# Patient Record
Sex: Male | Born: 1995 | Race: Black or African American | Hispanic: No | Marital: Single | Smoking: Never smoker
Health system: Southern US, Community
[De-identification: ages and names within clinical notes are randomized; demographics above are authoritative.]

## PROBLEM LIST (undated history)

## (undated) DIAGNOSIS — B009 Herpesviral infection, unspecified: Secondary | ICD-10-CM

## (undated) DIAGNOSIS — F909 Attention-deficit hyperactivity disorder, unspecified type: Secondary | ICD-10-CM

---
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
## 2017-06-17 ENCOUNTER — Encounter: Payer: Self-pay | Admitting: Family Medicine

## 2017-06-17 ENCOUNTER — Ambulatory Visit (INDEPENDENT_AMBULATORY_CARE_PROVIDER_SITE_OTHER): Payer: BLUE CROSS/BLUE SHIELD | Admitting: Family Medicine

## 2017-06-17 VITALS — BP 115/68 | HR 57 | Temp 98.2°F | Resp 16 | Ht 72.0 in | Wt 162.8 lb

## 2017-06-17 DIAGNOSIS — Z113 Encounter for screening for infections with a predominantly sexual mode of transmission: Secondary | ICD-10-CM

## 2017-06-17 DIAGNOSIS — Z0000 Encounter for general adult medical examination without abnormal findings: Secondary | ICD-10-CM | POA: Diagnosis not present

## 2017-06-17 NOTE — Progress Notes (Signed)
Chief Complaint  Patient presents with  . New Patient (Initial Visit)    CPE    Subjective:  George Cohen is a 21 y.o. male here for a health maintenance visit.  Patient is new pt  There are no active problems to display for this patient.   Past Medical History:  Diagnosis Date  . ADHD (attention deficit hyperactivity disorder)     History reviewed. No pertinent surgical history.   Outpatient Medications Prior to Visit  Medication Sig Dispense Refill  . lisdexamfetamine (VYVANSE) 20 MG capsule Take 40 mg by mouth every morning. Increased within the last 2 days     No facility-administered medications prior to visit.     No Known Allergies   History reviewed. No pertinent family history.   Health Habits: Dental Exam: up to date Eye Exam: up to date Exercise: 3 times/week on average Current exercise activities: cardio/weights/basketball Diet: balanced  Social History   Social History  . Marital status: Single    Spouse name: N/A  . Number of children: N/A  . Years of education: N/A   Occupational History  . Not on file.   Social History Main Topics  . Smoking status: Never Smoker  . Smokeless tobacco: Never Used  . Alcohol use No  . Drug use: No  . Sexual activity: Yes    Partners: Female   Other Topics Concern  . Not on file   Social History Narrative  . No narrative on file   History  Alcohol Use No   History  Smoking Status  . Never Smoker  Smokeless Tobacco  . Never Used   History  Drug Use No    Health Maintenance: See under health Maintenance activity for review of completion dates as well.  There is no immunization history on file for this patient.   Depression Screen-PHQ2/9 Depression screen [facility] 2/9 06/17/2017 06/17/2017  Decreased Interest 0 0  Down, Depressed, Hopeless 0 0  PHQ - 2 Score 0 0       Depression Severity and Treatment Recommendations:  0-4= None  5-9= Mild / Treatment: Support, educate to call if  worse; return in one month  10-14= Moderate / Treatment: Support, watchful waiting; Antidepressant or Psycotherapy  15-19= Moderately severe / Treatment: Antidepressant OR Psychotherapy  >= 20 = Major depression, severe / Antidepressant AND Psychotherapy    Review of Systems   Review of Systems  Constitutional: Negative for chills, fever and weight loss.  HENT: Negative for hearing loss and sinus pain.   Eyes: Negative for blurred vision and double vision.  Respiratory: Negative for cough and shortness of breath.   Cardiovascular: Negative for chest pain and palpitations.  Gastrointestinal: Negative for abdominal pain, nausea and vomiting.  Genitourinary: Negative for dysuria, frequency and urgency.  Skin: Negative for itching and rash.  Neurological: Negative for dizziness, tingling and headaches.  Psychiatric/Behavioral: Negative for depression. The patient is not nervous/anxious.     See HPI for ROS as well.    Objective:   Vitals:   06/17/17 1109  BP: 115/68  Pulse: (!) 57  Resp: 16  Temp: 98.2 F (36.8 C)  TempSrc: Oral  SpO2: 98%  Weight: 162 lb 12.8 oz (73.8 kg)  Height: 6' (1.829 m)    Body mass index is 22.08 kg/m.  Physical Exam  Constitutional: He is oriented to person, place, and time. He appears well-developed and well-nourished.  HENT:  Head: Normocephalic and atraumatic.  Right Ear: External ear normal.  Left Ear: External ear normal.  Nose: Nose normal.  Mouth/Throat: Oropharynx is clear and moist.  Eyes: Pupils are equal, round, and reactive to light. Conjunctivae and EOM are normal. Right eye exhibits no discharge. Left eye exhibits no discharge.  Neck: Normal range of motion. No thyromegaly present.  Cardiovascular: Normal rate, regular rhythm, normal heart sounds and intact distal pulses.   No murmur heard. Pulmonary/Chest: Effort normal and breath sounds normal. No respiratory distress. He has no wheezes. He has no rales.  Abdominal: Soft.  Bowel sounds are normal. He exhibits no distension. There is no tenderness. There is no rebound.  Neurological: He is alert and oriented to person, place, and time. He has normal reflexes.  Skin: No erythema.  Psychiatric: He has a normal mood and affect. His behavior is normal. Judgment and thought content normal.       Assessment/Plan:   Patient was seen for a health maintenance exam.  Counseled the patient on health maintenance issues. Reviewed her health mainteance schedule and ordered appropriate tests (see orders.) Counseled on regular exercise and weight management. Recommend regular eye exams and dental cleaning.   The following issues were addressed today for health maintenance:   George Cohen was seen today for new patient (initial visit).  Diagnoses and all orders for this visit:  Health maintenance examination-  Advised pt to continue healthy lifestyle  Screen for STD (sexually transmitted disease) -     HIV antibody -     GC/Chlamydia Probe Amp -     RPR -     Hepatitis B surface antigen    No Follow-up on file.    Body mass index is 22.08 kg/m.:  Discussed the patient's BMI with patient. The BMI body mass index is 22.08 kg/m.     No future appointments.  Patient Instructions    HealthCare Administration - discuss with your college advisor    IF you received an x-ray today, you will receive an invoice from [facility]. Please contact [facility] at [phone] with questions or concerns regarding your invoice.   IF you received labwork today, you will receive an invoice from [facility]. Please contact [facility] at [phone] with questions or concerns regarding your invoice.   Our billing staff will not be able to assist you with questions regarding bills from these companies.  You will be contacted with the lab results as soon as they are available. The fastest way to get your results is to activate your My Chart account. Instructions  are located on the last page of this paperwork. If you have not heard from us regarding the results in 2 weeks, please contact this office.

## 2017-06-17 NOTE — Patient Instructions (Addendum)
  HealthCare Administration - discuss with your college advisor    IF you received an x-ray today, you will receive an invoice from [facility]. Please contact [facility] at [phone] with questions or concerns regarding your invoice.   IF you received labwork today, you will receive an invoice from [facility]. Please contact [facility] at [phone] with questions or concerns regarding your invoice.   Our billing staff will not be able to assist you with questions regarding bills from these companies.  You will be contacted with the lab results as soon as they are available. The fastest way to get your results is to activate your My Chart account. Instructions are located on the last page of this paperwork. If you have not heard from us regarding the results in 2 weeks, please contact this office.

## 2017-06-18 LAB — RPR: RPR: NONREACTIVE

## 2017-06-18 LAB — HIV ANTIBODY (ROUTINE TESTING W REFLEX): HIV Screen 4th Generation wRfx: NONREACTIVE

## 2017-06-18 LAB — HEPATITIS B SURFACE ANTIGEN: Hepatitis B Surface Ag: NEGATIVE

## 2017-06-22 LAB — GC/CHLAMYDIA PROBE AMP
Chlamydia trachomatis, NAA: POSITIVE — AB
Neisseria gonorrhoeae by PCR: NEGATIVE

## 2017-06-23 MED ORDER — AZITHROMYCIN 500 MG PO TABS: ORAL_TABLET | ORAL | 0 refills | Status: DC

## 2017-06-23 NOTE — Addendum Note (Signed)
Addended by: Collie SiadSTALLINGS, Yuepheng Schaller A on: 06/23/2017 11:38 AM   Modules accepted: Orders

## 2018-05-12 ENCOUNTER — Ambulatory Visit (INDEPENDENT_AMBULATORY_CARE_PROVIDER_SITE_OTHER): Payer: 59 | Admitting: Family Medicine

## 2018-05-12 ENCOUNTER — Other Ambulatory Visit: Payer: Self-pay

## 2018-05-12 ENCOUNTER — Encounter: Payer: Self-pay | Admitting: Family Medicine

## 2018-05-12 VITALS — BP 102/60 | HR 84 | Temp 99.4°F | Resp 17 | Ht 72.0 in | Wt 168.8 lb

## 2018-05-12 DIAGNOSIS — M545 Low back pain, unspecified: Secondary | ICD-10-CM

## 2018-05-12 NOTE — Progress Notes (Signed)
Chief Complaint  Patient presents with  . Back Pain    onset: 05/01/18, hurt lower back left side lifting a box and pulled muscle in back.  Filed workman's comp and MD gave robaxin 500 mg to help with pain, per pt didn't help.  Pt goes to see MD on friday to see if he has healed.  Per pt back still hurts and wanted his MD to check him out.  Pain level 5/10.    HPI   Date of injury 05/01/18 Hurt back while lifting a box Pain on the left side of the back  He said robaxin 500mg  was being taken at night but did not help   Now he states that he has irritating pain that is sharp in the lower back  Pain is present with all movement Pain is better when he lays down Pain is 5/10 He states that the pain is annoying   There is no difficulty with movement or walking    Past Medical History:  Diagnosis Date  . ADHD (attention deficit hyperactivity disorder)     No current outpatient medications on file.   No current facility-administered medications for this visit.     Allergies: No Known Allergies  History reviewed. No pertinent surgical history.  Social History   Socioeconomic History  . Marital status: Single    Spouse name: Not on file  . Number of children: Not on file  . Years of education: Not on file  . Highest education level: Not on file  Occupational History  . Not on file  Social Needs  . Financial resource strain: Not on file  . Food insecurity:    Worry: Not on file    Inability: Not on file  . Transportation needs:    Medical: Not on file    Non-medical: Not on file  Tobacco Use  . Smoking status: Never Smoker  . Smokeless tobacco: Never Used  Substance and Sexual Activity  . Alcohol use: No  . Drug use: No  . Sexual activity: Yes    Partners: Female  Lifestyle  . Physical activity:    Days per week: Not on file    Minutes per session: Not on file  . Stress: Not on file  Relationships  . Social connections:    Talks on phone: Not on file   Gets together: Not on file    Attends religious service: Not on file    Active member of club or organization: Not on file    Attends meetings of clubs or organizations: Not on file    Relationship status: Not on file  Other Topics Concern  . Not on file  Social History Narrative  . Not on file    History reviewed. No pertinent family history.   ROS Review of Systems See HPI Constitution: No fevers or chills No malaise No diaphoresis Skin: No rash or itching Eyes: no blurry vision, no double vision GU: no dysuria or hematuria Neuro: no dizziness or headaches all others reviewed and negative   Objective: Vitals:   05/12/18 1206  BP: 102/60  Pulse: 84  Resp: 17  Temp: 99.4 F (37.4 C)  TempSrc: Oral  SpO2: 98%  Weight: 168 lb 12.8 oz (76.6 kg)  Height: 6' (1.829 m)    Physical Exam  Constitutional: He is oriented to person, place, and time. He appears well-developed and well-nourished.  HENT:  Head: Normocephalic and atraumatic.  Eyes: Conjunctivae and EOM are normal.  Neck: Normal range  of motion. Neck supple.  Cardiovascular: Normal rate, regular rhythm and normal heart sounds.  No murmur heard. Pulmonary/Chest: Effort normal and breath sounds normal. No stridor. No respiratory distress.  Neurological: He is alert and oriented to person, place, and time.  Skin: Skin is warm. Capillary refill takes less than 2 seconds.  Psychiatric: He has a normal mood and affect. His behavior is normal. Judgment and thought content normal.    Lumbar Radiculopathy Exam Back exam: full range of motion, no tenderness, palpable spasm or pain on motion. Straight-leg raise: **Positive Negative bilaterally Reflexes:       Right leg: 2+ at knees bilaterally      Left leg: 2+ at knees bilaterally Strength: normal and equal bilaterally  Sensory exam: normal in both lower extremities.  Able to toe walk, heel walk without difficulty or obvious weakness. No obvious pain with hip  motion or log rolling of leg.  Assessment and Plan George Cohen was seen today for back pain.  Diagnoses and all orders for this visit:  Acute left-sided low back pain without sciatica -     Ambulatory referral to Physical Therapy  -  Discussed continuing heat and cold  -  Advised pt continue nsaids and robaxin   Trinidad Ingle A Hazley Dezeeuw

## 2018-05-12 NOTE — Patient Instructions (Addendum)
IF you received an x-ray today, you will receive an invoice from [facility]. Please contact [facility] at [phone] with questions or concerns regarding your invoice.   IF you received labwork today, you will receive an invoice from [facility]. Please contact [facility] at [phone] with questions or concerns regarding your invoice.   Our billing staff will not be able to assist you with questions regarding bills from these companies.  You will be contacted with the lab results as soon as they are available. The fastest way to get your results is to activate your My Chart account. Instructions are located on the last page of this paperwork. If you have not heard from us regarding the results in 2 weeks, please contact this office.     Back Pain, Adult Many adults have back pain from time to time. Common causes of back pain include:  A strained muscle or ligament.  Wear and tear (degeneration) of the spinal disks.  Arthritis.  A hit to the back.  Back pain can be short-lived (acute) or last a long time (chronic). A physical exam, lab tests, and imaging studies may be done to find the cause of your pain. Follow these instructions at home: Managing pain and stiffness  Take over-the-counter and prescription medicines only as told by your health care provider.  If directed, apply heat to the affected area as often as told by your health care provider. Use the heat source that your health care provider recommends, such as a moist heat pack or a heating pad. ? Place a towel between your skin and the heat source. ? Leave the heat on for 20-30 minutes. ? Remove the heat if your skin turns bright red. This is especially important if you are unable to feel pain, heat, or cold. You have a greater risk of getting burned.  If directed, apply ice to the injured area: ? Put ice in a plastic bag. ? Place a towel between your skin and the bag. ? Leave the ice on for 20  minutes, 2-3 times a day for the first 2-3 days. Activity  Do not stay in bed. Resting more than 1-2 days can delay your recovery.  Take short walks on even surfaces as soon as you are able. Try to increase the length of time you walk each day.  Do not sit, drive, or stand in one place for more than 30 minutes at a time. Sitting or standing for long periods of time can put stress on your back.  Use proper lifting techniques. When you bend and lift, use positions that put less stress on your back: ? [place] Bend[place]end your knees. ? Keep the load close to your body. ? Avoid twisting.  Exercise regularly as told by your health care provider. Exercising will help your back heal faster. This also helps prevent back injuries by keeping muscles strong and flexible.  Your health care provider may recommend that you see a physical therapist. This person can help you come up with a safe exercise program. Do any exercises as told by your physical therapist. Lifestyle  Maintain a healthy weight. Extra weight puts stress on your back and makes it difficult to have good posture.  Avoid activities or situations that make you feel anxious or stressed. Learn ways to manage anxiety and stress. One way to manage stress is through exercise. Stress and anxiety increase muscle tension and can make back pain worse. General instructions  Sleep on a firm mattress  in a comfortable position. Try lying on your side with your knees slightly bent. If you lie on your back, put a pillow under your knees.  Follow your treatment plan as told by your health care provider. This may include: ? Cognitive or behavioral therapy. ? Acupuncture or massage therapy. ? Meditation or yoga. Contact a health care provider if:  You have pain that is not relieved with rest or medicine.  You have increasing pain going down into your legs or buttocks.  Your pain does not improve in 2 weeks.  You have pain at night.  You lose weight.  You  have a fever or chills. Get help right away if:  You develop new bowel or bladder control problems.  You have unusual weakness or numbness in your arms or legs.  You develop nausea or vomiting.  You develop abdominal pain.  You feel faint. Summary  Many adults have back pain from time to time. A physical exam, lab tests, and imaging studies may be done to find the cause of your pain.  Use proper lifting techniques. When you bend and lift, use positions that put less stress on your back.  Take over-the-counter and prescription medicines and apply heat or ice as directed by your health care provider. This information is not intended to replace advice given to you by your health care provider. Make sure you discuss any questions you have with your health care provider. Document Released: 10/27/2005 Document Revised: 12/01/2016 Document Reviewed: 12/01/2016 Elsevier Interactive Patient Education  [employer]2018 [employer]

## 2018-06-05 ENCOUNTER — Encounter: Payer: Self-pay | Admitting: Family Medicine

## 2019-03-03 ENCOUNTER — Telehealth: Payer: Self-pay | Admitting: Family Medicine

## 2019-03-03 DIAGNOSIS — Z113 Encounter for screening for infections with a predominantly sexual mode of transmission: Secondary | ICD-10-CM

## 2019-04-27 NOTE — Telephone Encounter (Signed)
No notes

## 2019-09-01 ENCOUNTER — Other Ambulatory Visit: Payer: Self-pay

## 2019-09-01 DIAGNOSIS — Z20822 Contact with and (suspected) exposure to covid-19: Secondary | ICD-10-CM

## 2019-09-03 LAB — NOVEL CORONAVIRUS, NAA: SARS-CoV-2, NAA: NOT DETECTED

## 2019-10-10 ENCOUNTER — Other Ambulatory Visit: Payer: Self-pay

## 2019-10-10 ENCOUNTER — Ambulatory Visit (INDEPENDENT_AMBULATORY_CARE_PROVIDER_SITE_OTHER): Payer: 59 | Admitting: Family Medicine

## 2019-10-10 VITALS — BP 122/75 | HR 64 | Temp 99.1°F | Ht 72.0 in | Wt 180.4 lb

## 2019-10-10 DIAGNOSIS — Z299 Encounter for prophylactic measures, unspecified: Secondary | ICD-10-CM | POA: Diagnosis not present

## 2019-10-10 DIAGNOSIS — K644 Residual hemorrhoidal skin tags: Secondary | ICD-10-CM

## 2019-10-10 DIAGNOSIS — Z23 Encounter for immunization: Secondary | ICD-10-CM

## 2019-10-10 NOTE — Patient Instructions (Addendum)
For hemorrhoids Use preparation H for any itching or irritation of the anus  The main treatment is to avoid constipation Drink plenty of water Use metamucil first thing in the morning before you eat.  Drink plenty of water after. Wait 30 minutes to eat breakfast.  Do this every other day for 2 weeks.   The bleeding should decrease over time once you fix the constipation.     If you have lab work done today you will be contacted with your lab results within the next 2 weeks.  If you have not heard from [place] then please contact us. The fastest way to get your results is to register for My Chart.   IF you received an x-ray today, you will receive an invoice from [facility]. Please contact [facility] at [phone] with questions or concerns regarding your invoice.   IF you received labwork today, you will receive an invoice from [facility]. Please contact [facility] at [phone] with questions or concerns regarding your invoice.   Our billing staff will not be able to assist you with questions regarding bills from these companies.  You will be contacted with the lab results as soon as they are available. The fastest way to get your results is to activate your My Chart account. Instructions are located on the last page of this paperwork. If you have not heard from [place] regarding the results in 2 weeks, please contact this office.     Hemorrhoids Hemorrhoids are swollen veins in and around the rectum or anus. There are two types of hemorrhoids:  Internal hemorrhoids. These occur in the veins that are just inside the rectum. They may poke through to the outside and become irritated and painful.  External hemorrhoids. These occur in the veins that are outside the anus and can be felt as a painful swelling or hard lump near the anus. Most hemorrhoids do not cause serious problems, and they can be managed with home treatments such as diet and lifestyle changes. If home treatments  do not help the symptoms, procedures can be done to shrink or remove the hemorrhoids. What are the causes? This condition is caused by increased pressure in the anal area. This pressure may result from various things, including:  Constipation.  Straining to have a bowel movement.  Diarrhea.  Pregnancy.  Obesity.  Sitting for long periods of time.  Heavy lifting or other activity that causes you to strain.  Anal sex.  Riding a bike for a long period of time. What are the signs or symptoms? Symptoms of this condition include:  Pain.  Anal itching or irritation.  Rectal bleeding.  Leakage of stool (feces).  Anal swelling.  One or more lumps around the anus. How is this diagnosed? This condition can often be diagnosed through a visual exam. Other exams or tests may also be done, such as:  An exam that involves feeling the rectal area with a gloved hand (digital rectal exam).  An exam of the anal canal that is done using a small tube (anoscope).  A blood test, if you have lost a significant amount of blood.  A test to look inside the colon using a flexible tube with a camera on the end (sigmoidoscopy or colonoscopy). How is this treated? This condition can usually be treated at home. However, various procedures may be done if dietary changes, lifestyle changes, and other home treatments do not help your symptoms. These procedures can help make the hemorrhoids smaller or remove  them completely. Some of these procedures involve surgery, and others do not. Common procedures include:  Rubber band ligation. Rubber bands are placed at the base of the hemorrhoids to cut off their blood supply.  Sclerotherapy. Medicine is injected into the hemorrhoids to shrink them.  Infrared coagulation. A type of light energy is used to get rid of the hemorrhoids.  Hemorrhoidectomy surgery. The hemorrhoids are surgically removed, and the veins that supply them are tied off.  Stapled  hemorrhoidopexy surgery. The surgeon staples the base of the hemorrhoid to the rectal wall. Follow these instructions at home: Eating and drinking   Eat foods that have a lot of fiber in them, such as whole grains, beans, nuts, fruits, and vegetables.  Ask your health care provider about taking products that have added fiber (fiber supplements).  Reduce the amount of fat in your diet. You can do this by eating low-fat dairy products, eating less red meat, and avoiding processed foods.  Drink enough fluid to keep your urine pale yellow. Managing pain and swelling   Take warm sitz baths for 20 minutes, 3-4 times a day to ease pain and discomfort. You may do this in a bathtub or using a portable sitz bath that fits over the toilet.  If directed, apply ice to the affected area. Using ice packs between sitz baths may be helpful. ? Put ice in a plastic bag. ? Place a towel between your skin and the bag. ? Leave the ice on for 20 minutes, 2-3 times a day. General instructions  Take over-the-counter and prescription medicines only as told by your health care provider.  Use medicated creams or suppositories as told.  Get regular exercise. Ask your health care provider how much and what kind of exercise is best for you. In general, you should do moderate exercise for at least 30 minutes on most days of the week (150 minutes each week). This can include activities such as walking, biking, or yoga.  Go to the bathroom when you have the urge to have a bowel movement. Do not wait.  Avoid straining to have bowel movements.  Keep the anal area dry and clean. Use wet toilet paper or moist towelettes after a bowel movement.  Do not sit on the toilet for long periods of time. This increases blood pooling and pain.  Keep all follow-up visits as told by your health care provider. This is important. Contact a health care provider if you have:  Increasing pain and swelling that are not controlled by  treatment or medicine.  Difficulty having a bowel movement, or you are unable to have a bowel movement.  Pain or inflammation outside the area of the hemorrhoids. Get help right away if you have:  Uncontrolled bleeding from your rectum. Summary  Hemorrhoids are swollen veins in and around the rectum or anus.  Most hemorrhoids can be managed with home treatments such as diet and lifestyle changes.  Taking warm sitz baths can help ease pain and discomfort.  In severe cases, procedures or surgery can be done to shrink or remove the hemorrhoids. This information is not intended to replace advice given to you by your health care provider. Make sure you discuss any questions you have with your health care provider. Document Released: 10/24/2000 Document Revised: 11/04/2018 Document Reviewed: 03/18/2018 Elsevier Patient Education  2020 [employer].

## 2019-10-10 NOTE — Progress Notes (Signed)
Established Patient Office Visit  Subjective:  Patient ID: George Cohen, male    DOB: 10/14/1996  Age: 23 y.o. MRN: [id number]  CC:  Chief Complaint  Patient presents with  . Rectal Pain    w/ bm.x 1 month. Pt stated that it hurts when he first start the bowel movement and then pain starts to decrease.    HPI George Cohen presents for   Last episode was yesterday He reports that he had his first episode of bleeding after taking a protein pill  He states that it has been present with each BM After his BM it feels uncomfortable He staates that 5-6 He has a little discomfort   Past Medical History:  Diagnosis Date  . ADHD (attention deficit hyperactivity disorder)     No past surgical history on file.  No family history on file.  Social History   Socioeconomic History  . Marital status: Single    Spouse name: Not on file  . Number of children: Not on file  . Years of education: Not on file  . Highest education level: Not on file  Occupational History  . Not on file  Tobacco Use  . Smoking status: Never Smoker  . Smokeless tobacco: Never Used  Substance and Sexual Activity  . Alcohol use: No  . Drug use: No  . Sexual activity: Yes    Partners: Female  Other Topics Concern  . Not on file  Social History Narrative  . Not on file   Social Determinants of Health   Financial Resource Strain:   . Difficulty of Paying Living Expenses: Not on file  Food Insecurity:   . Worried About Programme researcher, broadcasting/film/video in the Last Year: Not on file  . Ran Out of Food in the Last Year: Not on file  Transportation Needs:   . Lack of Transportation (Medical): Not on file  . Lack of Transportation (Non-Medical): Not on file  Physical Activity:   . Days of Exercise per Week: Not on file  . Minutes of Exercise per Session: Not on file  Stress:   . Feeling of Stress : Not on file  Social Connections:   . Frequency of Communication with Friends and Family: Not on  file  . Frequency of Social Gatherings with Friends and Family: Not on file  . Attends Religious Services: Not on file  . Active Member of Clubs or Organizations: Not on file  . Attends Banker Meetings: Not on file  . Marital Status: Not on file  Intimate Partner Violence:   . Fear of Current or Ex-Partner: Not on file  . Emotionally Abused: Not on file  . Physically Abused: Not on file  . Sexually Abused: Not on file    No outpatient medications prior to visit.   No facility-administered medications prior to visit.    No Known Allergies  ROS Review of Systems Review of Systems  Constitutional: Negative for activity change, appetite change, chills and fever.  HENT: Negative for congestion, nosebleeds, trouble swallowing and voice change.   Respiratory: Negative for cough, shortness of breath and wheezing.   Gastrointestinal: Negative for diarrhea, nausea and vomiting. See hpi Genitourinary: Negative for difficulty urinating, dysuria, flank pain and hematuria.  Musculoskeletal: Negative for back pain, joint swelling and neck pain.  Neurological: Negative for dizziness, speech difficulty, light-headedness and numbness.  See HPI. All other review of systems negative.     Objective:    Physical  Exam  BP 122/75 (BP Location: Right Arm, Patient Position: Sitting, Cuff Size: Normal)   Pulse 64   Temp 99.1 F (37.3 C) (Oral)   Ht 6' (1.829 m)   Wt 180 lb 6.4 oz (81.8 kg)   SpO2 97%   BMI 24.47 kg/m  Wt Readings from Last 3 Encounters:  10/10/19 180 lb 6.4 oz (81.8 kg)  05/12/18 168 lb 12.8 oz (76.6 kg)  06/17/17 162 lb 12.8 oz (73.8 kg) (62 %, Z= 0.32)*   * Growth percentiles are based on CDC (Boys, 2-20 Years) data.   Physical Exam  Constitutional: Oriented to person, place, and time. Appears well-developed and well-nourished.  HENT:  Head: Normocephalic and atraumatic.  Eyes: Conjunctivae and EOM are normal.  Cardiovascular: Normal rate, regular  rhythm, normal heart sounds and intact distal pulses.  No murmur heard. Pulmonary/Chest: Effort normal and breath sounds normal. No stridor. No respiratory distress. Has no wheezes.  Neurological: Is alert and oriented to person, place, and time.  Skin: Skin is warm. Capillary refill takes less than 2 seconds.  Psychiatric: Has a normal mood and affect. Behavior is normal. Judgment and thought content normal.   Chaperone present Rectal exam shows normal rectal tone, anal wink, no fissures noted, small hemorrhoid palpated, no gross blood  Health Maintenance Due  Topic Date Due  . TETANUS/TDAP  11/05/2016  . INFLUENZA VACCINE  06/11/2019    There are no preventive care reminders to display for this patient.     Assessment & Plan:   Problem List Items Addressed This Visit    None    Visit Diagnoses    External hemorrhoid    -  Primary   Need for prophylactic vaccination and inoculation against influenza       Need for prophylactic measure          No orders of the defined types were placed in this encounter.   Follow-up: No follow-ups on file.    Forrest Moron, MD

## 2019-12-07 ENCOUNTER — Ambulatory Visit: Payer: 59 | Attending: Internal Medicine

## 2019-12-07 DIAGNOSIS — Z20822 Contact with and (suspected) exposure to covid-19: Secondary | ICD-10-CM | POA: Insufficient documentation

## 2019-12-09 LAB — NOVEL CORONAVIRUS, NAA: SARS-CoV-2, NAA: NOT DETECTED

## 2020-02-01 ENCOUNTER — Ambulatory Visit
Admission: EM | Admit: 2020-02-01 | Discharge: 2020-02-01 | Disposition: A | Discharge status: Home / Self Care | Payer: 59 | Attending: Physician Assistant | Admitting: Physician Assistant

## 2020-02-01 DIAGNOSIS — M25531 Pain in right wrist: Secondary | ICD-10-CM | POA: Diagnosis not present

## 2020-02-01 MED ORDER — MELOXICAM 7.5 MG PO TABS: 7.5000 mg | ORAL_TABLET | Freq: Every day | ORAL | 0 refills | Status: DC

## 2020-02-01 NOTE — ED Triage Notes (Signed)
Pt c/o rt hand pain when gripping weights. Denies injury. States pain has worsen in past 3 days.

## 2020-02-01 NOTE — Discharge Instructions (Signed)
Start Mobic. Do not take ibuprofen (motrin/advil)/ naproxen (aleve) while on mobic. Ice compress to the wrist, wrist brace during activity. No heavy lifting for at least 1 week, then slowly build up as tolerated. Follow up with sports medicine if symptoms not improving.

## 2020-02-01 NOTE — ED Provider Notes (Signed)
[name]    CSN: [id number] Arrival date & time: 02/01/20  1117      History   Chief Complaint Chief Complaint  Patient presents with  . Hand Pain    HPI George Cohen is a 24 y.o. male.   24 year old male comes in for few day history of right hand pain. Denies injury/trauma. Has pain at rest to the radial wrist that is worse with movement. Occasional radiation of pain to mid forearm. Denies swelling, erythema, warmth. Denies numbness/tingling. Denies loss of grip strength.      Past Medical History:  Diagnosis Date  . ADHD (attention deficit hyperactivity disorder)     There are no problems to display for this patient.   History reviewed. No pertinent surgical history.     Home Medications    Prior to Admission medications   Medication Sig Start Date End Date Taking? Authorizing Provider  meloxicam (MOBIC) 7.5 MG tablet Take 1 tablet (7.5 mg total) by mouth daily. 02/01/20   Belinda Fisher, PA-C    Family History History reviewed. No pertinent family history.  Social History Social History   Tobacco Use  . Smoking status: Never Smoker  . Smokeless tobacco: Never Used  Substance Use Topics  . Alcohol use: No  . Drug use: No     Allergies   Patient has no known allergies.   Review of Systems Review of Systems  Reason unable to perform ROS: See HPI as above.     Physical Exam Triage Vital Signs ED Triage Vitals  Enc Vitals Group     BP 02/01/20 1130 122/78     Pulse Rate 02/01/20 1130 (!) 56     Resp 02/01/20 1130 16     Temp 02/01/20 1130 98 F (36.7 C)     Temp Source 02/01/20 1130 Oral     SpO2 02/01/20 1130 99 %     Weight --      Height --      Head Circumference --      Peak Flow --      Pain Score 02/01/20 1131 8     Pain Loc --      Pain Edu? --      Excl. in GC? --    No data found.  Updated Vital Signs BP 122/78 (BP Location: Left Arm)   Pulse (!) 56   Temp 98 F (36.7 C) (Oral)   Resp 16   SpO2  99%   Visual Acuity Right Eye Distance:   Left Eye Distance:   Bilateral Distance:    Right Eye Near:   Left Eye Near:    Bilateral Near:     Physical Exam Constitutional:      General: He is not in acute distress.    Appearance: Normal appearance. He is well-developed. He is not toxic-appearing or diaphoretic.  HENT:     Head: Normocephalic and atraumatic.  Eyes:     Conjunctiva/sclera: Conjunctivae normal.     Pupils: Pupils are equal, round, and reactive to light.  Pulmonary:     Effort: Pulmonary effort is normal. No respiratory distress.     Comments: Speaking in full sentences without difficulty Musculoskeletal:     Cervical back: Normal range of motion and neck supple.     Comments: No swelling, erythema, warmth, contusion. No tenderness to elbow, forearm. Tenderness to flexor radial wrist. No tenderness to the hand. Full ROM of BUE. Strength 4/5 to the  right wrist/grip strength due to pain. Sensation intact. Radial pulse 2+  Skin:    General: Skin is warm and dry.  Neurological:     Mental Status: He is alert and oriented to person, place, and time.      UC Treatments / Results  Labs (all labs ordered are listed, but only abnormal results are displayed) Labs Reviewed - No data to display  EKG   Radiology No results found.  Procedures Procedures (including critical care time)  Medications Ordered in UC Medications - No data to display  Initial Impression / Assessment and Plan / UC Course  I have reviewed the triage vital signs and the nursing notes.  Pertinent labs & imaging results that were available during my care of the patient were reviewed by me and considered in my medical decision making (see chart for details).    Discussed possible strain vs tendinitis. NSAIDs, ice compress, rest, wrist splint during activity. Return precautions given. Patient expresses understanding and agrees to plan.  Final Clinical Impressions(s) / UC Diagnoses   Final  diagnoses:  Right wrist pain   ED Prescriptions    Medication Sig Dispense Auth. Provider   meloxicam (MOBIC) 7.5 MG tablet Take 1 tablet (7.5 mg total) by mouth daily. 20 tablet Ok Edwards, PA-C     PDMP not reviewed this encounter.   Ok Edwards, PA-C 02/01/20 1309

## 2020-02-16 ENCOUNTER — Ambulatory Visit: Payer: 59 | Attending: Family

## 2020-02-16 DIAGNOSIS — Z23 Encounter for immunization: Secondary | ICD-10-CM

## 2020-02-16 NOTE — Progress Notes (Signed)
   Covid-19 Vaccination Clinic  Name:  George Cohen    MRN: [id number] DOB: 1996-05-22  02/16/2020  Mr. Foot was observed post Covid-19 immunization for 15 minutes without incident. He was provided with Vaccine Information Sheet and instruction to access the V-Safe system.   [name] was instructed to call 911 with any severe reactions post vaccine: Marland Kitchen Difficulty breathing  . Swelling of face and throat  . A fast heartbeat  . A bad rash all over body  . Dizziness and weakness   Immunizations Administered    Name Date Dose VIS Date Route   Moderna COVID-19 Vaccine 02/16/2020  1:54 PM 0.5 mL 10/11/2019 Intramuscular   Manufacturer: Moderna   Lot: [id number]   NDC: 72091-068-16

## 2020-03-20 ENCOUNTER — Ambulatory Visit: Payer: 59 | Attending: Family

## 2020-03-20 DIAGNOSIS — Z23 Encounter for immunization: Secondary | ICD-10-CM

## 2020-03-20 NOTE — Progress Notes (Signed)
   Covid-19 Vaccination Clinic  Name:  George Cohen    MRN: [id number] DOB: 30-Jul-1996  03/20/2020  [name] was observed post Covid-19 immunization for 15 minutes without incident. He was provided with Vaccine Information Sheet and instruction to access the V-Safe system.   [name] was instructed to call 911 with any severe reactions post vaccine: Marland Kitchen Difficulty breathing  . Swelling of face and throat  . A fast heartbeat  . A bad rash all over body  . Dizziness and weakness   Immunizations Administered    Name Date Dose VIS Date Route   Moderna COVID-19 Vaccine 03/20/2020  1:11 PM 0.5 mL 10/2019 Intramuscular   Manufacturer: Moderna   Lot: [id number]   NDC: 55001-642-90

## 2020-05-11 ENCOUNTER — Other Ambulatory Visit: Payer: Self-pay

## 2020-05-11 ENCOUNTER — Encounter: Payer: Self-pay | Admitting: Emergency Medicine

## 2020-05-11 ENCOUNTER — Ambulatory Visit
Admission: EM | Admit: 2020-05-11 | Discharge: 2020-05-11 | Disposition: A | Discharge status: Home / Self Care | Payer: 59 | Attending: Emergency Medicine | Admitting: Emergency Medicine

## 2020-05-11 ENCOUNTER — Ambulatory Visit (INDEPENDENT_AMBULATORY_CARE_PROVIDER_SITE_OTHER): Payer: 59

## 2020-05-11 ENCOUNTER — Telehealth: Payer: Self-pay | Admitting: Emergency Medicine

## 2020-05-11 DIAGNOSIS — Y9367 Activity, basketball: Secondary | ICD-10-CM | POA: Diagnosis not present

## 2020-05-11 DIAGNOSIS — S93401A Sprain of unspecified ligament of right ankle, initial encounter: Secondary | ICD-10-CM

## 2020-05-11 DIAGNOSIS — M25571 Pain in right ankle and joints of right foot: Secondary | ICD-10-CM | POA: Diagnosis not present

## 2020-05-11 MED ORDER — IBUPROFEN 800 MG PO TABS: 800.0000 mg | ORAL_TABLET | Freq: Three times a day (TID) | ORAL | 0 refills | Status: DC

## 2020-05-11 NOTE — ED Triage Notes (Signed)
Pt presents to [facility] for assessment after twisitng his ankle landing on someone else's foot while playing basketball approx 1 hour ago.  Patient has open area to foot, but denies specific injury to cause it.  Swelling noted.

## 2020-05-11 NOTE — ED Notes (Signed)
Patient able to ambulate independently with crutches 

## 2020-05-11 NOTE — Telephone Encounter (Signed)
Pt called stating he would like a boot for his ankle instead of the ASO we provided originally.  Will switch out ASO for boot for him upon return, ordered by [place], APP.  Irena Cords updated.

## 2020-05-11 NOTE — Discharge Instructions (Signed)

## 2020-05-11 NOTE — ED Provider Notes (Signed)
[name]    CSN: [id number] Arrival date & time: 05/11/20  1430      History   Chief Complaint Chief Complaint  Patient presents with  . Ankle Pain    HPI George Cohen is a 24 y.o. male presenting for right ankle pain and swelling status post injury 1 hour PTA.  States he was playing basketball, landed and twisted his ankle (inversion) on 7 off his foot.  No head trauma, LOC.  No numbness, deformity.    Past Medical History:  Diagnosis Date  . ADHD (attention deficit hyperactivity disorder)     There are no problems to display for this patient.   History reviewed. No pertinent surgical history.     Home Medications    Prior to Admission medications   Medication Sig Start Date End Date Taking? Authorizing Provider  ibuprofen (ADVIL) 800 MG tablet Take 1 tablet (800 mg total) by mouth 3 (three) times daily. 05/11/20   Hall-Potvin, [place], PA-C    Family History History reviewed. No pertinent family history.  Social History Social History   Tobacco Use  . Smoking status: Never Smoker  . Smokeless tobacco: Never Used  Substance Use Topics  . Alcohol use: No  . Drug use: No     Allergies   Patient has no known allergies.   Review of Systems As per HPI   Physical Exam Triage Vital Signs ED Triage Vitals  Enc Vitals Group     BP      Pulse      Resp      Temp      Temp src      SpO2      Weight      Height      Head Circumference      Peak Flow      Pain Score      Pain Loc      Pain Edu?      Excl. in GC?    No data found.  Updated Vital Signs BP 112/71 (BP Location: Left Arm)   Pulse 84   Temp 98.4 F (36.9 C) (Oral)   Resp 16   SpO2 97%   Visual Acuity Right Eye Distance:   Left Eye Distance:   Bilateral Distance:    Right Eye Near:   Left Eye Near:    Bilateral Near:     Physical Exam Constitutional:      General: He is not in acute distress. HENT:     Head: Normocephalic and atraumatic.    Eyes:     General: No scleral icterus.    Pupils: Pupils are equal, round, and reactive to light.  Cardiovascular:     Rate and Rhythm: Normal rate.  Pulmonary:     Effort: Pulmonary effort is normal. No respiratory distress.     Breath sounds: No wheezing.  Musculoskeletal:        General: Swelling and tenderness present. No deformity.     Right lower leg: No edema.     Left lower leg: No edema.     Comments: Decreased ROM second to pain and swelling.  Endorsing significant pain and swelling over lateral malleolus.  Neurovascularly intact  Skin:    Coloration: Skin is not jaundiced or pale.  Neurological:     Mental Status: He is alert and oriented to person, place, and time.      UC Treatments / Results  Labs (all labs ordered are listed,  but only abnormal results are displayed) Labs Reviewed - No data to display  EKG   Radiology DG Ankle Complete Right  Result Date: 05/11/2020 CLINICAL DATA:  Injury, pain EXAM: RIGHT ANKLE - COMPLETE 3+ VIEW COMPARISON:  None. FINDINGS: Lateral soft tissue swelling. No acute bony abnormality. Specifically, no fracture, subluxation, or dislocation. Joint spaces maintained. IMPRESSION: No acute bony abnormality. Electronically Signed   By: Charlett Nose M.D.   On: 05/11/2020 15:11    Procedures Procedures (including critical care time)  Medications Ordered in UC Medications - No data to display  Initial Impression / Assessment and Plan / UC Course  I have reviewed the triage vital signs and the nursing notes.  Pertinent labs & imaging results that were available during my care of the patient were reviewed by me and considered in my medical decision making (see chart for details).     X-ray done office, reviewed by me radiology: Negative for acute bony abnormality, positive for lateral soft tissue swelling.  Reviewed findings with patient who verbalized understanding.  Placed in ASO brace, given crutches, as well as sports medicine  follow-up information.  Return precautions discussed, patient verbalized understanding and is agreeable to plan. Final Clinical Impressions(s) / UC Diagnoses   Final diagnoses:  Sprain of right ankle, unspecified ligament, initial encounter     Discharge Instructions     Recommend RICE: rest, ice, compression, elevation as needed for pain.    Heat therapy (hot compress, warm wash rag, hot showers, etc.) can help relax muscles and soothe muscle aches. Cold therapy (ice packs) can be used to help swelling both after injury and after prolonged use of areas of chronic pain/aches.  For pain: recommend 350 mg-1000 mg of Tylenol (acetaminophen) and/or 200 mg - 800 mg of Advil (ibuprofen, Motrin) every 8 hours as needed.  May alternate between the two throughout the day as they are generally safe to take together.  DO NOT exceed more than 3000 mg of Tylenol or 3200 mg of ibuprofen in a 24 hour period as this could damage your stomach, kidneys, liver, or increase your bleeding risk.    ED Prescriptions    Medication Sig Dispense Auth. Provider   ibuprofen (ADVIL) 800 MG tablet Take 1 tablet (800 mg total) by mouth 3 (three) times daily. 21 tablet Hall-Potvin, [place], PA-C     PDMP not reviewed this encounter.   Hall-Potvin, [place], [place] 05/11/20 1540

## 2020-05-31 ENCOUNTER — Ambulatory Visit
Admission: EM | Admit: 2020-05-31 | Discharge: 2020-05-31 | Disposition: A | Discharge status: Home / Self Care | Payer: 59

## 2020-05-31 ENCOUNTER — Encounter: Payer: Self-pay | Admitting: Emergency Medicine

## 2020-05-31 ENCOUNTER — Other Ambulatory Visit: Payer: Self-pay

## 2020-05-31 DIAGNOSIS — S93401D Sprain of unspecified ligament of right ankle, subsequent encounter: Secondary | ICD-10-CM | POA: Diagnosis not present

## 2020-05-31 NOTE — ED Triage Notes (Signed)
Pt presents to [facility] for continued pain to the right ankle since his last visit.  States he has not been doing any treatments for it, but it continues to hurt.  Did not follow up with orthopedics.

## 2020-05-31 NOTE — ED Provider Notes (Signed)
[name]    CSN: [id number] Arrival date & time: 05/31/20  1448      History   Chief Complaint Chief Complaint  Patient presents with  . Ankle Pain    HPI DYER KLUG III is a 24 y.o. male.   Patient had an inversion injury to right ankle and was seen here on July 2.  X-ray was negative.  He did not follow-up with orthopedics as directed.  He still continues with pain and is unable to play basketball.  HPI  Past Medical History:  Diagnosis Date  . ADHD (attention deficit hyperactivity disorder)     There are no problems to display for this patient.   History reviewed. No pertinent surgical history.     Home Medications    Prior to Admission medications   Medication Sig Start Date End Date Taking? Authorizing Provider  ibuprofen (ADVIL) 800 MG tablet Take 1 tablet (800 mg total) by mouth 3 (three) times daily. 05/11/20   Hall-Potvin, [place], PA-C    Family History History reviewed. No pertinent family history.  Social History Social History   Tobacco Use  . Smoking status: Never Smoker  . Smokeless tobacco: Never Used  Substance Use Topics  . Alcohol use: No  . Drug use: No     Allergies   Patient has no known allergies.   Review of Systems Review of Systems  Musculoskeletal: Positive for joint swelling.       Right ankle pain  All other systems reviewed and are negative.    Physical Exam Triage Vital Signs ED Triage Vitals [05/31/20 1459]  Enc Vitals Group     BP 118/69     Pulse Rate 72     Resp 16     Temp 98.1 F (36.7 C)     Temp Source Oral     SpO2 98 %     Weight      Height      Head Circumference      Peak Flow      Pain Score 0     Pain Loc      Pain Edu?      Excl. in GC?    No data found.  Updated Vital Signs BP 118/69 (BP Location: Left Arm)   Pulse 72   Temp 98.1 F (36.7 C) (Oral)   Resp 16   SpO2 98%   Visual Acuity Right Eye Distance:   Left Eye Distance:   Bilateral Distance:      Right Eye Near:   Left Eye Near:    Bilateral Near:     Physical Exam Vitals and nursing note reviewed.  Musculoskeletal:     Comments: Right ankle: There is normal anatomy grossly.  Strength is tested with internal and external versions as well as flexion and extension and all is normal.      UC Treatments / Results  Labs (all labs ordered are listed, but only abnormal results are displayed) Labs Reviewed - No data to display  EKG   Radiology No results found.  Procedures Procedures (including critical care time)  Medications Ordered in UC Medications - No data to display  Initial Impression / Assessment and Plan / UC Course  I have reviewed the triage vital signs and the nursing notes.  Pertinent labs & imaging results that were available during my care of the patient were reviewed by me and considered in my medical decision making (see chart for details).  Right ankle sprain Final Clinical Impressions(s) / UC Diagnoses   Final diagnoses:  None   Discharge Instructions   None    ED Prescriptions    None     PDMP not reviewed this encounter.   Frederica Kuster, MD 05/31/20 [phone]

## 2020-05-31 NOTE — ED Notes (Signed)
Patient able to ambulate independently  

## 2020-07-23 ENCOUNTER — Ambulatory Visit: Payer: 59 | Admitting: Family Medicine

## 2021-05-28 ENCOUNTER — Other Ambulatory Visit: Payer: Self-pay

## 2021-05-28 ENCOUNTER — Ambulatory Visit
Admission: EM | Admit: 2021-05-28 | Discharge: 2021-05-28 | Disposition: A | Discharge status: Home / Self Care | Payer: 59 | Attending: Emergency Medicine | Admitting: Emergency Medicine

## 2021-05-28 ENCOUNTER — Encounter: Payer: Self-pay | Admitting: Emergency Medicine

## 2021-05-28 DIAGNOSIS — R21 Rash and other nonspecific skin eruption: Secondary | ICD-10-CM | POA: Insufficient documentation

## 2021-05-28 MED ORDER — TRIAMCINOLONE ACETONIDE 0.025 % EX OINT: 1.0000 "application " | TOPICAL_OINTMENT | Freq: Two times a day (BID) | CUTANEOUS | 0 refills | Status: AC

## 2021-05-28 MED ORDER — VALACYCLOVIR HCL 1 G PO TABS: 1000.0000 mg | ORAL_TABLET | Freq: Two times a day (BID) | ORAL | 0 refills | Status: AC

## 2021-05-28 NOTE — ED Triage Notes (Signed)
Pt here with diffuse red rash and itching on shaft of penis. Denies any unprotected sex and only has one partner who recently gave birth. Denies discharge or dysuria.

## 2021-05-28 NOTE — ED Provider Notes (Signed)
HPI  SUBJECTIVE:  George Cohen is a 25 y.o. male who presents with an erythematous, vesicular, pruritic rash on the shaft of his penis starting yesterday.  He denies preceding paresthesias.  He is in a long-term monogamous relationship with a male who is asymptomatic.  She has no history of HSV.  No urinary complaints, discharge, testicular scrotal pain or swelling.  He has not been sexually active since June 24.  They did not use condoms or lubricants.  No fevers, body aches, flulike symptoms.  No rash elsewhere.  He has never had symptoms like this before.  He tried Dermoplast without improvement in his symptoms.  No aggravating factors.  Past medical history negative for diabetes, gonorrhea, chlamydia, HIV, HSV, syphilis, trichomonas.  PMD: None   Past Medical History:  Diagnosis Date   ADHD (attention deficit hyperactivity disorder)     History reviewed. No pertinent surgical history.  History reviewed. No pertinent family history.  Social History   Tobacco Use   Smoking status: Never   Smokeless tobacco: Never  Substance Use Topics   Alcohol use: No   Drug use: No    No current facility-administered medications for this encounter.  Current Outpatient Medications:    triamcinolone (KENALOG) 0.025 % ointment, Apply 1 application topically 2 (two) times daily for 14 days. As needed, Disp: 30 g, Rfl: 0   valACYclovir (VALTREX) 1000 MG tablet, Take 1 tablet (1,000 mg total) by mouth 2 (two) times daily for 10 days., Disp: 20 tablet, Rfl: 0  No Known Allergies   ROS  As noted in HPI.   Physical Exam  BP 117/80 (BP Location: Right Arm)   Pulse 61   Temp 97.9 F (36.6 C) (Oral)   Resp 20   SpO2 97%   Constitutional: Well developed, well nourished, no acute distress Eyes:  EOMI, conjunctiva normal bilaterally HENT: Normocephalic, atraumatic,mucus membranes moist Respiratory: Normal inspiratory effort Cardiovascular: Normal rate GI: nondistended GU: Normal  circumcised male.  No discharge.  2 groups of vesicular slightly tender rash on penis.  No crusting.  No epididymal, testicular tenderness, swelling.  No scrotal erythema, edema.  Patient declined chaperone  Lymph: No inguinal lymphadenopathy skin: See GU exam Musculoskeletal: no deformities Neurologic: Alert & oriented x 3, no focal neuro deficits Psychiatric: Speech and behavior appropriate   ED Course   Medications - No data to display  No orders of the defined types were placed in this encounter.   No results found for this or any previous visit (from the past 24 hour(s)). No results found.   ED Clinical Impression  1. Penile rash      ED Assessment/Plan  Concern for herpes.  Cleaned the rash with alcohol and use a sterile 18-gauge needle to unroofed several lesions with clear fluid and bloody return.  Sending this off for HSV cultures.  Had extensive discussion with patient About herpes transmission and asymptomatic infections.  Discussed with him that I do not know how long this has been there. Will send home with Valtrex, triamcinolone for the itching.  Will provide primary care list for ongoing care and order assistance in finding a PMD.  Herpes culture results pending at the time of signing of this note.  Discussed labs, MDM, treatment plan, and plan for follow-up with patient.  patient agrees with plan.   Meds ordered this encounter  Medications   valACYclovir (VALTREX) 1000 MG tablet    Sig: Take 1 tablet (1,000 mg total) by mouth 2 (  two) times daily for 10 days.    Dispense:  20 tablet    Refill:  0   triamcinolone (KENALOG) 0.025 % ointment    Sig: Apply 1 application topically 2 (two) times daily for 14 days. As needed    Dispense:  30 g    Refill:  0      *This clinic note was created using Scientist, clinical (histocompatibility and immunogenetics). Therefore, there may be occasional mistakes despite careful proofreading.  ?    Domenick Gong, MD 05/30/21 [phone]

## 2021-05-28 NOTE — Discharge Instructions (Addendum)
This appears to be a herpes rash.  I am sending home with medication to reduce the severity of the outbreak.  You can try the triamcinolone for itching.  Testing will be back in several days.  You will get the results through MyChart.  Below is a list of primary care practices who are taking new patients for you to follow-up with.  [facility] internal medicine clinic Ground Floor - [facility], [address] [place], [place], [place] [address] [phone]  [facility] [address] [place], [place] [address] [phone]  [facility] [address] [place], [place] [address] [phone]  [facility] [phone] [address] [place] [address]  [facility]: [address] [place] [address]  [phone]  [facility]: [address] [place] [address]  [phone]  [facility] : [address] [place] [address] [phone]  [facility]: [address] [employer] [place] [address] [phone]  [facility]: [address] [place] [address] [phone]  Dr. Oneal Grout [address] [facility] [place] [address]  [phone]  Go to www.goodrx.com  or www.costplusdrugs.com to look up your medications. This will give you a list of where you can find your prescriptions at the most affordable prices. Or ask the pharmacist what the cash price is, or if they have any other discount programs available to help make your medication more affordable. This can be less expensive than what you would pay with insurance.

## 2021-05-30 LAB — HSV CULTURE AND TYPING

## 2021-06-07 ENCOUNTER — Encounter (HOSPITAL_COMMUNITY): Payer: Self-pay

## 2021-08-06 IMAGING — DX DG ANKLE COMPLETE 3+V*R*
3 series · 3 of 3 positions shown · non-contrast
Comparison: None.

CLINICAL DATA: Injury, pain

EXAM:
RIGHT ANKLE - COMPLETE 3+ VIEW

[ankle ap]
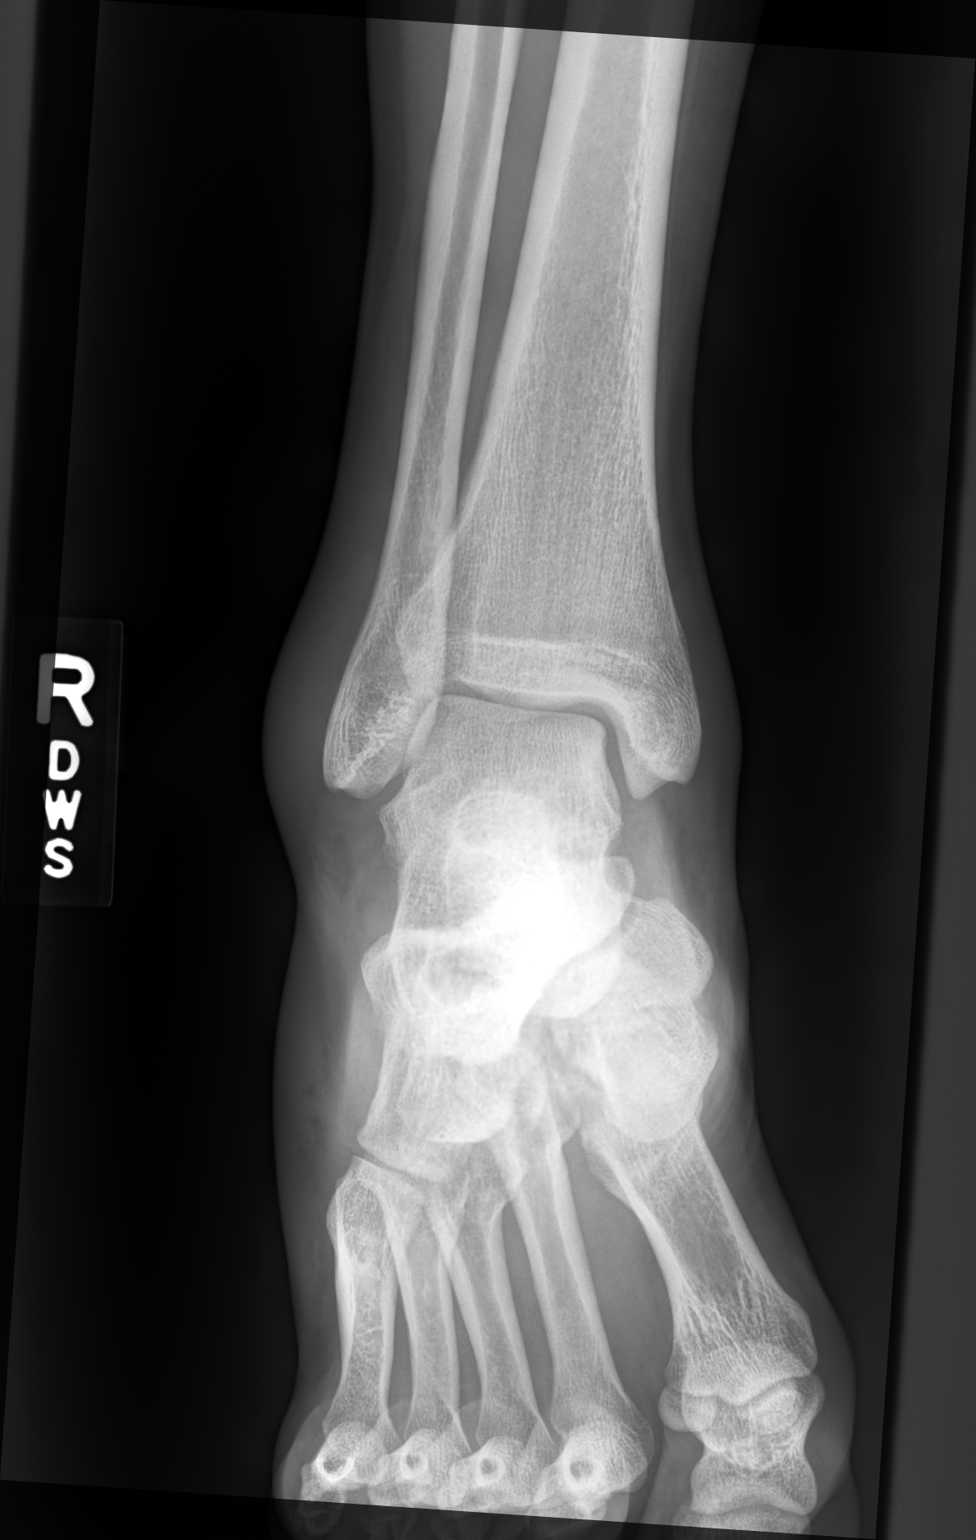

[ankle medial oblique]
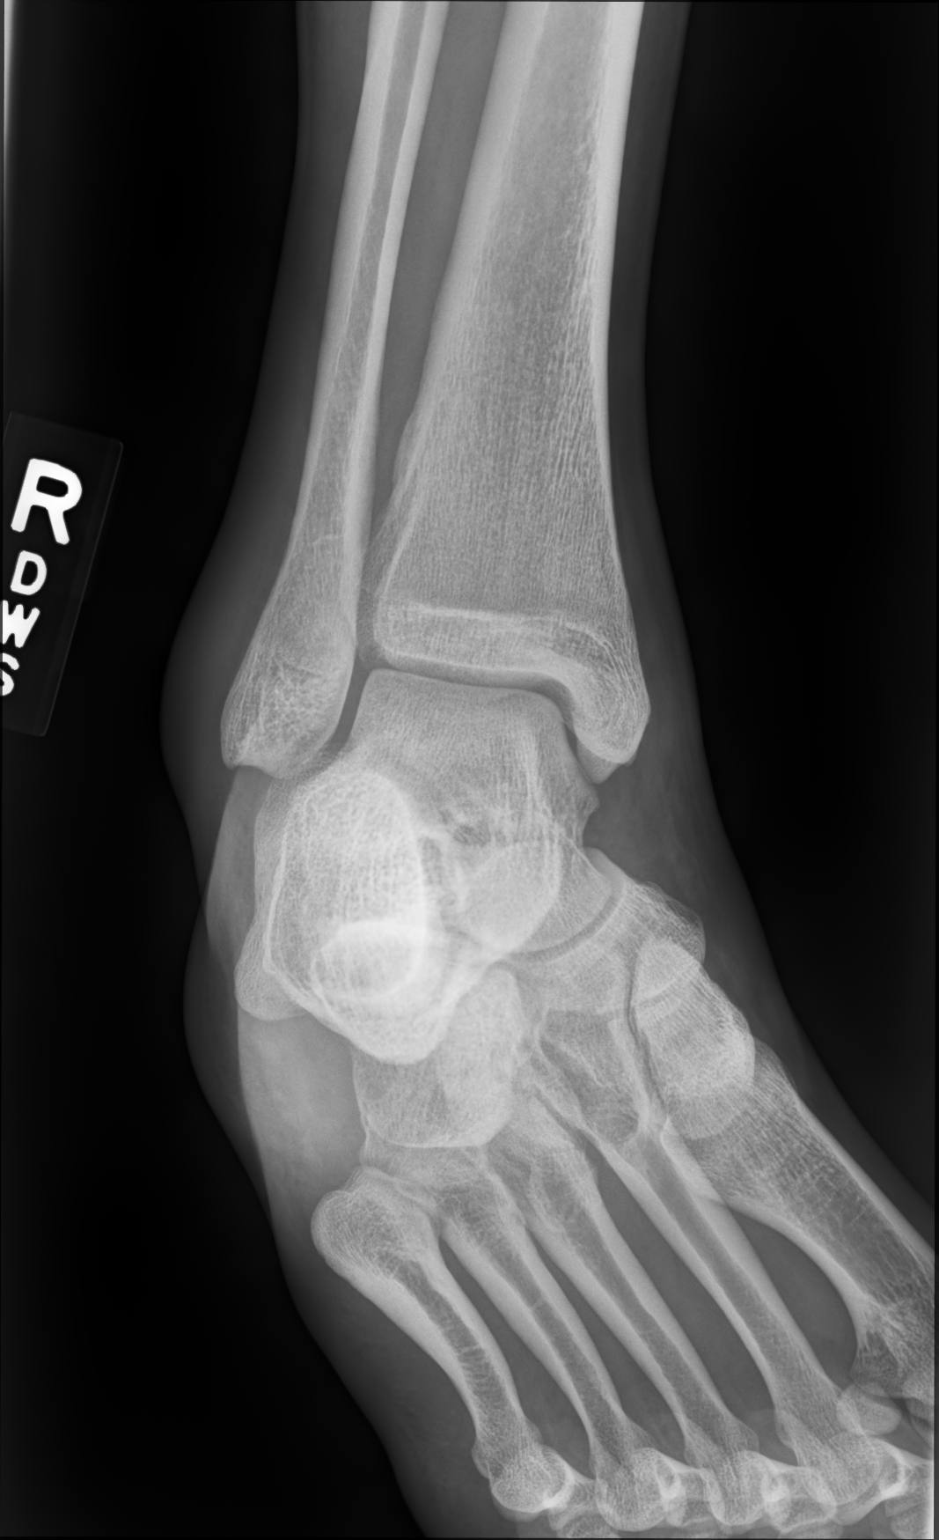

[ankle lat]
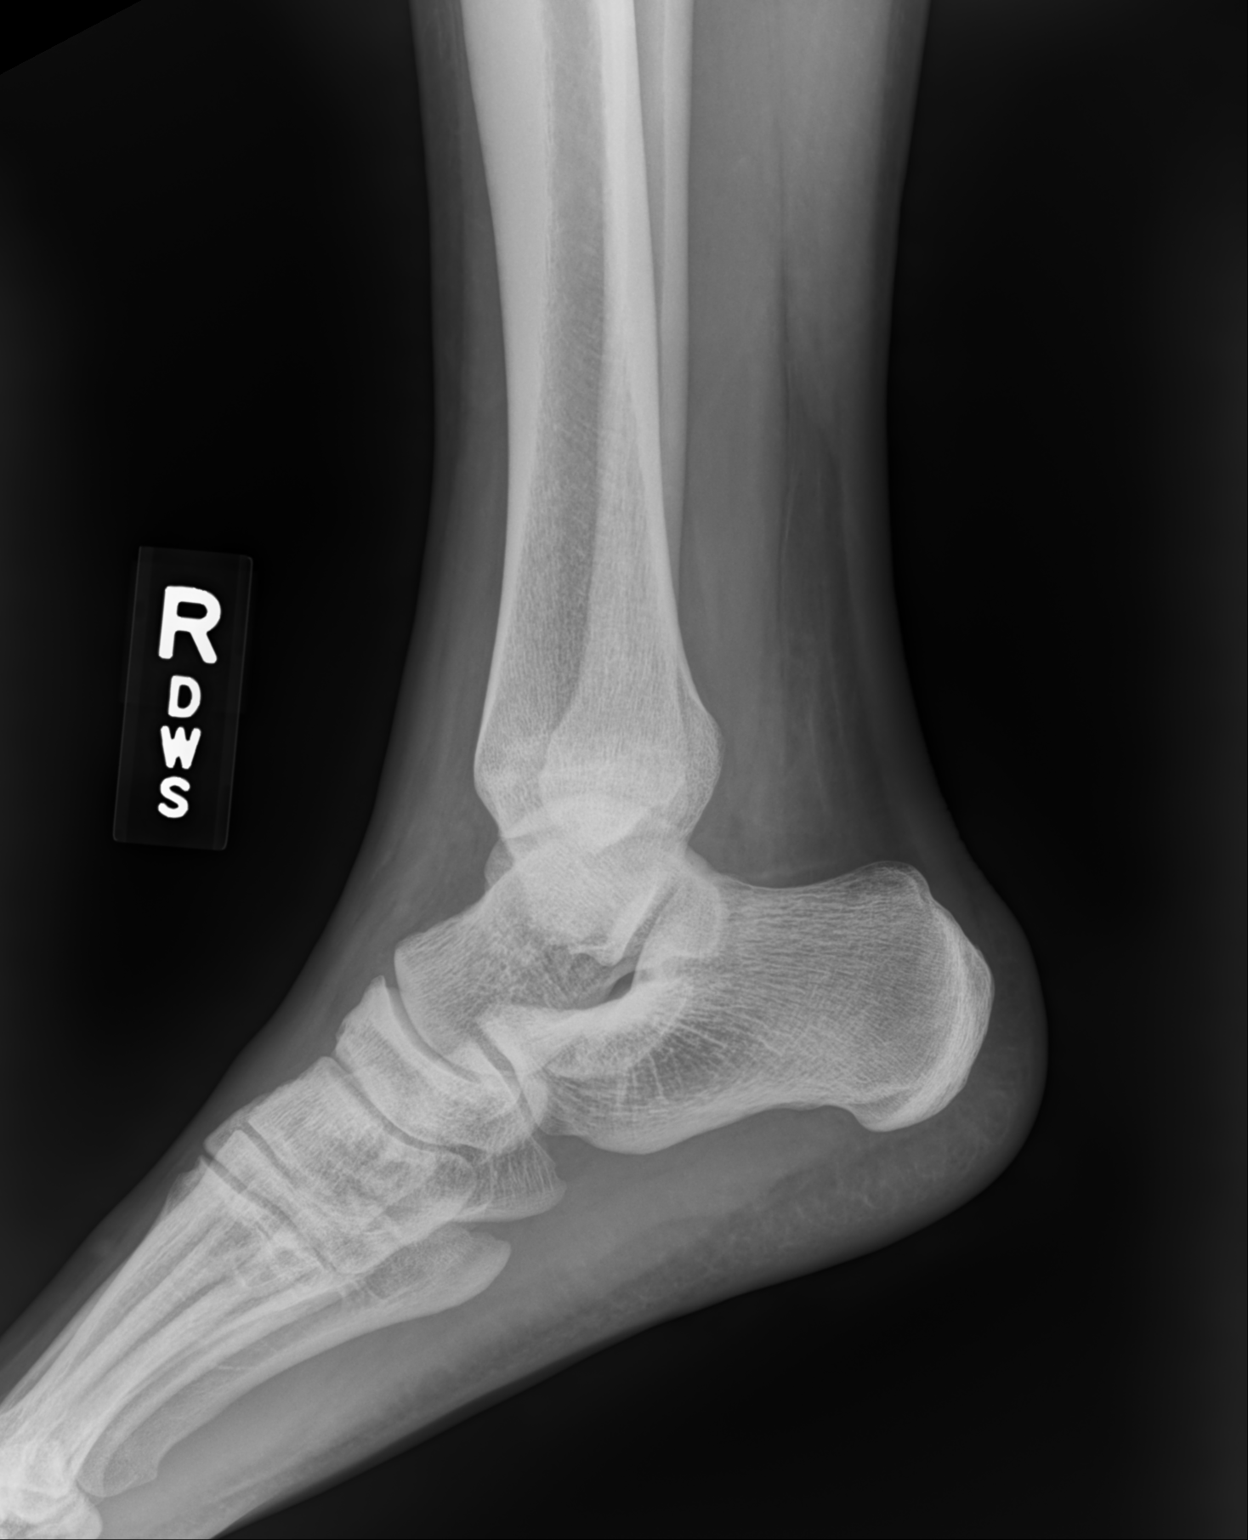

[3 of 3 positions shown; findings below may reference images not displayed]

FINDINGS: Lateral soft tissue swelling. No acute bony abnormality.
Specifically, no fracture, subluxation, or dislocation. Joint spaces
maintained.
IMPRESSION: No acute bony abnormality.

## 2021-10-28 ENCOUNTER — Other Ambulatory Visit (HOSPITAL_BASED_OUTPATIENT_CLINIC_OR_DEPARTMENT_OTHER): Payer: Self-pay

## 2021-10-28 ENCOUNTER — Other Ambulatory Visit: Payer: Self-pay

## 2021-10-28 ENCOUNTER — Ambulatory Visit
Admission: EM | Admit: 2021-10-28 | Discharge: 2021-10-28 | Disposition: A | Discharge status: Home / Self Care | Payer: 59 | Attending: Physician Assistant | Admitting: Physician Assistant

## 2021-10-28 DIAGNOSIS — B009 Herpesviral infection, unspecified: Secondary | ICD-10-CM

## 2021-10-28 HISTORY — DX: Herpesviral infection, unspecified: B00.9

## 2021-10-28 MED ORDER — TRIAMCINOLONE ACETONIDE 0.025 % EX OINT: 1.0000 "application " | TOPICAL_OINTMENT | Freq: Two times a day (BID) | CUTANEOUS | 0 refills | Status: AC

## 2021-10-28 NOTE — ED Triage Notes (Signed)
Pt c/o herpes lesion and is looking for cream

## 2021-10-28 NOTE — ED Provider Notes (Signed)
[name]    CSN: [id number] Arrival date & time: 10/28/21  1801      History   Chief Complaint Chief Complaint  Patient presents with   herpes outbreak    HPI George Cohen is a 25 y.o. male.   Patient here today for evaluation of likely herpes outbreak.  He reports that he has penile lesions very similar to prior outbreaks.  He request triamcinolone ointment to be refilled.  He still has Valtrex at home to take orally.  He denies any new concerns and declines further STD screening.  The history is provided by the patient.   Past Medical History:  Diagnosis Date   ADHD (attention deficit hyperactivity disorder)    Herpes     There are no problems to display for this patient.   History reviewed. No pertinent surgical history.     Home Medications    Prior to Admission medications   Medication Sig Start Date End Date Taking? Authorizing Provider  triamcinolone (KENALOG) 0.025 % ointment Apply 1 application topically 2 (two) times daily. 10/28/21  Yes Tomi Bamberger, PA-C    Family History History reviewed. No pertinent family history.  Social History Social History   Tobacco Use   Smoking status: Never   Smokeless tobacco: Never  Substance Use Topics   Alcohol use: No   Drug use: No     Allergies   Patient has no known allergies.   Review of Systems Review of Systems  Constitutional:  Negative for chills and fever.  Eyes:  Negative for discharge and redness.  Respiratory:  Negative for shortness of breath.   Genitourinary:  Positive for genital sores. Negative for penile discharge.  Skin:  Positive for color change and wound.  Neurological:  Negative for numbness.    Physical Exam Triage Vital Signs ED Triage Vitals  Enc Vitals Group     BP 10/28/21 1847 (!) 139/92     Pulse Rate 10/28/21 1847 86     Resp 10/28/21 1847 18     Temp 10/28/21 1847 99 F (37.2 C)     Temp Source 10/28/21 1847 Oral     SpO2 10/28/21  1847 99 %     Weight --      Height --      Head Circumference --      Peak Flow --      Pain Score 10/28/21 1848 0     Pain Loc --      Pain Edu? --      Excl. in GC? --    No data found.  Updated Vital Signs BP (!) 139/92 (BP Location: Left Arm)    Pulse 86    Temp 99 F (37.2 C) (Oral)    Resp 18    SpO2 99%      Physical Exam Vitals and nursing note reviewed.  Constitutional:      General: He is not in acute distress.    Appearance: Normal appearance. He is not ill-appearing.  HENT:     Head: Normocephalic and atraumatic.  Eyes:     Conjunctiva/sclera: Conjunctivae normal.  Cardiovascular:     Rate and Rhythm: Normal rate.  Pulmonary:     Effort: Pulmonary effort is normal.  Neurological:     Mental Status: He is alert.  Psychiatric:        Mood and Affect: Mood normal.        Behavior: Behavior normal.  Thought Content: Thought content normal.     UC Treatments / Results  Labs (all labs ordered are listed, but only abnormal results are displayed) Labs Reviewed - No data to display  EKG   Radiology No results found.  Procedures Procedures (including critical care time)  Medications Ordered in UC Medications - No data to display  Initial Impression / Assessment and Plan / UC Course  I have reviewed the triage vital signs and the nursing notes.  Pertinent labs & imaging results that were available during my care of the patient were reviewed by me and considered in my medical decision making (see chart for details).    Triamcinolone refilled as requested but advised to use no longer than for 5 days.  Recommended patient take Valtrex daily for the next 4 days.  Encouraged follow-up with any further concerns.  Final Clinical Impressions(s) / UC Diagnoses   Final diagnoses:  Herpes   Discharge Instructions   None    ED Prescriptions     Medication Sig Dispense Auth. Provider   triamcinolone (KENALOG) 0.025 % ointment Apply 1 application  topically 2 (two) times daily. 30 g Tomi Bamberger, PA-C      PDMP not reviewed this encounter.   Tomi Bamberger, PA-C 10/28/21 1918

## 2022-10-03 ENCOUNTER — Encounter (HOSPITAL_COMMUNITY): Payer: Self-pay

## 2022-10-03 ENCOUNTER — Emergency Department (HOSPITAL_COMMUNITY)
Admission: EM | Admit: 2022-10-03 | Discharge: 2022-10-03 | Disposition: A | Discharge status: Home / Self Care | Payer: 59 | Attending: Emergency Medicine | Admitting: Emergency Medicine

## 2022-10-03 ENCOUNTER — Other Ambulatory Visit: Payer: Self-pay

## 2022-10-03 DIAGNOSIS — M545 Low back pain, unspecified: Secondary | ICD-10-CM | POA: Diagnosis present

## 2022-10-03 DIAGNOSIS — M5442 Lumbago with sciatica, left side: Secondary | ICD-10-CM | POA: Diagnosis not present

## 2022-10-03 MED ORDER — NAPROXEN 500 MG PO TABS: 500.0000 mg | ORAL_TABLET | Freq: Two times a day (BID) | ORAL | 0 refills | Status: AC

## 2022-10-03 MED ORDER — METHOCARBAMOL 500 MG PO TABS: 500.0000 mg | ORAL_TABLET | Freq: Two times a day (BID) | ORAL | 0 refills | Status: DC

## 2022-10-03 NOTE — ED Provider Notes (Signed)
[facility]-EMERGENCY DEPT Provider Note   CSN: [id number] Arrival date & time: 10/03/22  2239     History  Chief Complaint  Patient presents with   Back Pain    George Cohen is a 26 y.o. male with a past medical history significant for ADHD and chronic low back pain who presents to the ED due to left-sided low back pain which he notes was aggravated at work today.  No direct injury.  Patient notes pain occasionally radiates to right side and then down left leg.  Denies saddle anesthesia, bowel/bladder incontinence, lower extremity numbness/tingling, lower extremity weakness.  No IV drug use.  Denies fever or chills.  No urinary symptoms.  Denies abdominal pain.  No fever or chills. No previous surgery. Follows [facility] for chronic low back pain.  History obtained from patient and past medical records. No interpreter used during encounter.       Home Medications Prior to Admission medications   Medication Sig Start Date End Date Taking? Authorizing Provider  methocarbamol (ROBAXIN) 500 MG tablet Take 1 tablet (500 mg total) by mouth 2 (two) times daily. 10/03/22  Yes Lai Hendriks C, PA-C  naproxen (NAPROSYN) 500 MG tablet Take 1 tablet (500 mg total) by mouth 2 (two) times daily. 10/03/22  Yes Kaytee Taliercio C, PA-C  triamcinolone (KENALOG) 0.025 % ointment Apply 1 application topically 2 (two) times daily. 10/28/21   Tomi Bamberger, PA-C      Allergies    Patient has no known allergies.    Review of Systems   Review of Systems  Gastrointestinal:  Negative for abdominal pain.  Musculoskeletal:  Positive for back pain.  Neurological:  Negative for weakness and numbness.  All other systems reviewed and are negative.   Physical Exam Updated Vital Signs BP 118/77   Pulse 60   Temp 97.8 F (36.6 C) (Oral)   Resp 13   Ht 6' (1.829 m)   Wt 81.6 kg   SpO2 100%   BMI 24.41 kg/m  Physical Exam Vitals and nursing note reviewed.   Constitutional:      General: He is not in acute distress.    Appearance: He is not ill-appearing.  HENT:     Head: Normocephalic.  Eyes:     Pupils: Pupils are equal, round, and reactive to light.  Cardiovascular:     Rate and Rhythm: Normal rate and regular rhythm.     Pulses: Normal pulses.     Heart sounds: Normal heart sounds. No murmur heard.    No friction rub. No gallop.  Pulmonary:     Effort: Pulmonary effort is normal.     Breath sounds: Normal breath sounds.  Abdominal:     General: Abdomen is flat. There is no distension.     Palpations: Abdomen is soft.     Tenderness: There is no abdominal tenderness. There is no guarding or rebound.  Musculoskeletal:        General: Normal range of motion.     Cervical back: Neck supple.     Comments: No thoracic or lumbar midline tenderness.  Bilateral lower extremities neurovascularly intact.  Patient able to ambulate in the ED.  Skin:    General: Skin is warm and dry.  Neurological:     General: No focal deficit present.     Mental Status: He is alert.  Psychiatric:        Mood and Affect: Mood normal.  Behavior: Behavior normal.     ED Results / Procedures / Treatments   Labs (all labs ordered are listed, but only abnormal results are displayed) Labs Reviewed - No data to display  EKG None  Radiology No results found.  Procedures Procedures    Medications Ordered in ED Medications - No data to display  ED Course/ Medical Decision Making/ A&P                           Medical Decision Making  26 year old male presents to the ED due to acute on chronic left low back pain.  Patient has a history of chronic low back pain.  Patient followed by Raechel Chute for chronic back pain.  He notes he aggravated his back at work today.  No direct injury.  Denies saddle anesthesia, bowel/bladder incontinence, lower extremity numbness/pain, lower extremity weakness.  No IV drug use.  Denies fever and chills.  Upon  arrival, stable vitals.  Patient in no acute distress.  No thoracic or lumbar midline tenderness.  No reproducible tenderness in paraspinal region.  Abdomen soft, nondistended, nontender.  Patient able to ambulate in the ED.  Unfortunately, patient drove himself to the ED so unable to give any sedating medications.  Will discharge patient with naproxen and Robaxin.  Low back exercises given to patient at discharge.  Advised patient to follow-up with orthopedic surgeon for further evaluation.  Low suspicion for cauda equina or central cord compression.  No infectious symptoms to suggest infectious etiology. Strict ED precautions discussed with patient. Patient states understanding and agrees to plan. Patient discharged home in no acute distress and stable vitals        Final Clinical Impression(s) / ED Diagnoses Final diagnoses:  Acute left-sided low back pain with left-sided sciatica    Rx / DC Orders ED Discharge Orders          Ordered    naproxen (NAPROSYN) 500 MG tablet  2 times daily        10/03/22 2308    methocarbamol (ROBAXIN) 500 MG tablet  2 times daily        10/03/22 2308              Jesusita Oka 10/03/22 2311    Lorre Nick, MD 10/04/22 [phone]

## 2022-10-03 NOTE — ED Triage Notes (Signed)
Lower left sided back pain aggravated with movement.

## 2022-10-03 NOTE — Discharge Instructions (Signed)
It was a pleasure taking care of you today.  As discussed, I am sending you home with pain medication and a muscle relaxer.  Take as needed for back pain.  I have included low back exercises.  Perform daily.  Muscle relaxer can cause drowsiness so do not drive or operate machinery while on the medication.  Please follow-up with your orthopedic surgeon for further evaluation.  Return to the ER for new or worsening symptoms.

## 2023-02-03 ENCOUNTER — Ambulatory Visit: Payer: 59 | Admitting: Nurse Practitioner

## 2023-02-03 ENCOUNTER — Telehealth: Payer: Self-pay | Admitting: General Practice

## 2023-02-03 NOTE — Progress Notes (Deleted)
   New Patient Visit  There were no vitals taken for this visit.   Subjective:    Patient ID: George Cohen, male    DOB: 1996-03-03, 27 y.o.   MRN: [id number]  CC: No chief complaint on file.   HPI: George Cohen is a 27 y.o. male presents for new patient visit to establish care.  Introduced to Designer, jewellery role and practice setting.  All questions answered.  Discussed provider/patient relationship and expectations.   Past Medical History:  Diagnosis Date   ADHD (attention deficit hyperactivity disorder)    Herpes     No past surgical history on file.  No family history on file.   Social History   Tobacco Use   Smoking status: Never   Smokeless tobacco: Never  Substance Use Topics   Alcohol use: No   Drug use: No    Current Outpatient Medications on File Prior to Visit  Medication Sig Dispense Refill   methocarbamol (ROBAXIN) 500 MG tablet Take 1 tablet (500 mg total) by mouth 2 (two) times daily. 20 tablet 0   naproxen (NAPROSYN) 500 MG tablet Take 1 tablet (500 mg total) by mouth 2 (two) times daily. 30 tablet 0   triamcinolone (KENALOG) 0.025 % ointment Apply 1 application topically 2 (two) times daily. 30 g 0   No current facility-administered medications on file prior to visit.     Review of Systems      Objective:    There were no vitals taken for this visit.  Wt Readings from Last 3 Encounters:  10/03/22 180 lb (81.6 kg)  10/10/19 180 lb 6.4 oz (81.8 kg)  05/12/18 168 lb 12.8 oz (76.6 kg)    BP Readings from Last 3 Encounters:  10/03/22 118/77  10/28/21 (!) 139/92  05/28/21 117/80    Physical Exam     Assessment & Plan:   Problem List Items Addressed This Visit   None    Follow up plan: No follow-ups on file.

## 2023-02-03 NOTE — Telephone Encounter (Signed)
Pt showed up late for his NP app on 02/03/23 with Lauren, I modified his chart to where he can not reschedule. I did not send a letter.

## 2023-07-06 ENCOUNTER — Ambulatory Visit: Payer: 59

## 2023-07-06 ENCOUNTER — Ambulatory Visit
Admission: EM | Admit: 2023-07-06 | Discharge: 2023-07-06 | Disposition: A | Discharge status: Home / Self Care | Payer: 59 | Attending: Family Medicine | Admitting: Family Medicine

## 2023-07-06 DIAGNOSIS — M545 Low back pain, unspecified: Secondary | ICD-10-CM

## 2023-07-06 DIAGNOSIS — M5440 Lumbago with sciatica, unspecified side: Secondary | ICD-10-CM | POA: Diagnosis not present

## 2023-07-06 MED ORDER — METHOCARBAMOL 500 MG PO TABS: 500.0000 mg | ORAL_TABLET | Freq: Two times a day (BID) | ORAL | 0 refills | Status: AC

## 2023-07-06 NOTE — ED Triage Notes (Addendum)
"  I think I strained my tail bone on Tuesday". "Doing Calisthenics at [facility] after 12 midnight and then I got a funny feeling in my tailbone but I did not fall". The next morning "my head hurt and my tail bone". Pain is increasing.

## 2023-07-06 NOTE — Discharge Instructions (Addendum)
You were seen today for back pain.  Your xray appears normal, but I am still awaiting the official read from the radiologist.  We will call and notify you of those results.  In the mean time I have sent out a muscle relaxer to help with your pain.  You may continue motrin for pain, as well as heat/ice.   Please return or go to the ER if you are not improving or worsening.

## 2023-07-06 NOTE — ED Provider Notes (Signed)
[name]    CSN: [id number] Arrival date & time: 07/06/23  1249      History   Chief Complaint Chief Complaint  Patient presents with   Injury    HPI George Cohen is a 27 y.o. male.    Injury He was doing a handstand push up about 1 week ago.  Right after that his tailbone felt weird.  He did not fall.  He woke up the next morning and was present, but improved throughout the day.  Every single day he has had worsening pain.  When he stands he gets a squeezing feeling at the tailbone on both sides.  It did feel better yesterday have motrin, icy hot and a shower.  He states it is spreading throughout the legs as well.  It is just pain, but no weakness per se.  Difficulty with movement, esp bending over.   No diarrhea or constipation.  No urinary symptoms.  No penile d/c or irritation.       Past Medical History:  Diagnosis Date   ADHD (attention deficit hyperactivity disorder)    Herpes     There are no problems to display for this patient.   History reviewed. No pertinent surgical history.     Home Medications    Prior to Admission medications   Medication Sig Start Date End Date Taking? Authorizing Provider  methocarbamol (ROBAXIN) 500 MG tablet Take 1 tablet (500 mg total) by mouth 2 (two) times daily. 10/03/22   Mannie Stabile, PA-C  naproxen (NAPROSYN) 500 MG tablet Take 1 tablet (500 mg total) by mouth 2 (two) times daily. 10/03/22   Mannie Stabile, PA-C  triamcinolone (KENALOG) 0.025 % ointment Apply 1 application topically 2 (two) times daily. 10/28/21   Tomi Bamberger, PA-C    Family History History reviewed. No pertinent family history.  Social History Social History   Tobacco Use   Smoking status: Never   Smokeless tobacco: Never  Vaping Use   Vaping status: Never Used  Substance Use Topics   Alcohol use: No   Drug use: No     Allergies   Patient has no known allergies.   Review of Systems Review  of Systems  Constitutional: Negative.   HENT: Negative.    Respiratory: Negative.    Cardiovascular: Negative.   Gastrointestinal: Negative.   Genitourinary: Negative.   Musculoskeletal:  Positive for back pain.     Physical Exam Triage Vital Signs ED Triage Vitals  Encounter Vitals Group     BP 07/06/23 1258 111/72     Systolic BP Percentile --      Diastolic BP Percentile --      Pulse Rate 07/06/23 1258 76     Resp 07/06/23 1258 18     Temp 07/06/23 1258 98 F (36.7 C)     Temp Source 07/06/23 1258 Oral     SpO2 07/06/23 1258 99 %     Weight 07/06/23 1257 180 lb (81.6 kg)     Height 07/06/23 1257 5\' 11"  (1.803 m)     Head Circumference --      Peak Flow --      Pain Score 07/06/23 1253 10     Pain Loc --      Pain Education --      Exclude from Growth Chart --    No data found.  Updated Vital Signs BP 111/72 (BP Location: Left Arm)   Pulse 76   Temp  98 F (36.7 C) (Oral)   Resp 18   Ht 5\' 11"  (1.803 m)   Wt 81.6 kg   SpO2 99%   BMI 25.10 kg/m   Visual Acuity Right Eye Distance:   Left Eye Distance:   Bilateral Distance:    Right Eye Near:   Left Eye Near:    Bilateral Near:     Physical Exam Constitutional:      Appearance: Normal appearance.  Musculoskeletal:     Right lower leg: Normal.     Left lower leg: Normal.     Comments: Slight TTP to the sacral/tailbone area;  TTP across the upper buttocks;  pain with movement in general;  unable to bend down to touch toes due to pain  Skin:    General: Skin is warm.  Neurological:     General: No focal deficit present.     Mental Status: He is alert.     Sensory: Sensation is intact.     Motor: Motor function is intact.     Coordination: Coordination is intact.     Gait: Gait is intact.  Psychiatric:        Mood and Affect: Mood normal.     UC Treatments / Results  Labs (all labs ordered are listed, but only abnormal results are displayed) Labs Reviewed - No data to  display  EKG   Radiology No results found.  Procedures Procedures (including critical care time)  Medications Ordered in UC Medications - No data to display  Initial Impression / Assessment and Plan / UC Course  I have reviewed the triage vital signs and the nursing notes.  Pertinent labs & imaging results that were available during my care of the patient were reviewed by me and considered in my medical decision making (see chart for details).  Patient was seen today for back pain/leg pain after working out.  No known injury.  I do not see anything evident on xray, but will await radiologists' read.  Will notify the patient if there is anything concerning.  In the mean time will treat with a muscle relaxer, heat/ice.  Advise to return if he is not improving or worsening.   Final Clinical Impressions(s) / UC Diagnoses   Final diagnoses:  Acute bilateral low back pain without sciatica     Discharge Instructions      You were seen today for back pain.  Your xray appears normal, but I am still awaiting the official read from the radiologist.  We will call and notify you of those results.  In the mean time I have sent out a muscle relaxer to help with your pain.  You may continue motrin for pain, as well as heat/ice.   Please return or go to the ER if you are not improving or worsening.     ED Prescriptions     Medication Sig Dispense Auth. Provider   methocarbamol (ROBAXIN) 500 MG tablet Take 1 tablet (500 mg total) by mouth 2 (two) times daily. 20 tablet Jannifer Franklin, MD      PDMP not reviewed this encounter.   Jannifer Franklin, MD 07/06/23 [id number]
